# Patient Record
Sex: Male | Born: 1995 | Race: Black or African American | Hispanic: No | Marital: Single | Smoking: Never smoker
Health system: Southern US, Community
[De-identification: ages and names within clinical notes are randomized; demographics above are authoritative.]

---
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
## 2022-02-10 ENCOUNTER — Encounter (HOSPITAL_COMMUNITY): Payer: Self-pay | Admitting: Emergency Medicine

## 2022-02-10 ENCOUNTER — Emergency Department (HOSPITAL_COMMUNITY)
Admission: EM | Admit: 2022-02-10 | Discharge: 2022-02-10 | Disposition: A | Discharge status: Home / Self Care | Payer: BLUE CROSS/BLUE SHIELD | Attending: Emergency Medicine | Admitting: Emergency Medicine

## 2022-02-10 DIAGNOSIS — R55 Syncope and collapse: Secondary | ICD-10-CM | POA: Insufficient documentation

## 2022-02-10 DIAGNOSIS — U071 COVID-19: Secondary | ICD-10-CM

## 2022-02-10 DIAGNOSIS — R509 Fever, unspecified: Secondary | ICD-10-CM | POA: Insufficient documentation

## 2022-02-10 LAB — CBC WITH DIFFERENTIAL/PLATELET
Abs Immature Granulocytes: 0 10*3/uL (ref 0.00–0.07)
Basophils Absolute: 0 10*3/uL (ref 0.0–0.1)
Basophils Relative: 0 %
Eosinophils Absolute: 0 10*3/uL (ref 0.0–0.5)
Eosinophils Relative: 0 %
HCT: 45 % (ref 39.0–52.0)
Hemoglobin: 15.4 g/dL (ref 13.0–17.0)
Immature Granulocytes: 0 %
Lymphocytes Relative: 28 %
Lymphs Abs: 0.9 10*3/uL (ref 0.7–4.0)
MCH: 28.5 pg (ref 26.0–34.0)
MCHC: 34.2 g/dL (ref 30.0–36.0)
MCV: 83.3 fL (ref 80.0–100.0)
Monocytes Absolute: 0.5 10*3/uL (ref 0.1–1.0)
Monocytes Relative: 14 %
Neutro Abs: 1.9 10*3/uL (ref 1.7–7.7)
Neutrophils Relative %: 58 %
Platelets: 202 10*3/uL (ref 150–400)
RBC: 5.4 MIL/uL (ref 4.22–5.81)
RDW: 14 % (ref 11.5–15.5)
WBC: 3.2 10*3/uL — ABNORMAL LOW (ref 4.0–10.5)
nRBC: 0 % (ref 0.0–0.2)

## 2022-02-10 LAB — COMPREHENSIVE METABOLIC PANEL
ALT: 19 U/L (ref 0–44)
AST: 25 U/L (ref 15–41)
Albumin: 3.8 g/dL (ref 3.5–5.0)
Alkaline Phosphatase: 28 U/L — ABNORMAL LOW (ref 38–126)
Anion gap: 8 (ref 5–15)
BUN: 9 mg/dL (ref 6–20)
CO2: 24 mmol/L (ref 22–32)
Calcium: 8.8 mg/dL — ABNORMAL LOW (ref 8.9–10.3)
Chloride: 106 mmol/L (ref 98–111)
Creatinine, Ser: 1.13 mg/dL (ref 0.61–1.24)
GFR, Estimated: >60 mL/min (ref >60)
Glucose, Bld: 110 mg/dL — ABNORMAL HIGH (ref 70–99)
Potassium: 4 mmol/L (ref 3.5–5.1)
Sodium: 138 mmol/L (ref 135–145)
Total Bilirubin: 0.5 mg/dL (ref 0.3–1.2)
Total Protein: 7 g/dL (ref 6.5–8.1)

## 2022-02-10 MED ORDER — ACETAMINOPHEN 325 MG PO TABS
650.0000 mg | ORAL_TABLET | Freq: Once | ORAL | Status: AC
  Administered 2022-02-10: 650 mg via ORAL
  Filled 2022-02-10: qty 2

## 2022-02-10 MED ORDER — ACETAMINOPHEN 500 MG PO TABS
1000.0000 mg | ORAL_TABLET | Freq: Once | ORAL | Status: DC
  Filled 2022-02-10: qty 2

## 2022-02-10 NOTE — ED Provider Notes (Signed)
[facility] EMERGENCY DEPARTMENT Provider Note   CSN: [id number] Arrival date & time: 02/10/22  0440     History  Chief Complaint  Patient presents with   Loss of Consciousness    Eric Nguyen is a 26 y.o. male.  Patient presents via EMS after brief syncopal event. Pt indicates dx with covid two days ago, this AM got up to go to bathroom, felt lightheaded/faint, and w syncope. Denies injury or pain. No preceding chest pain or discomfort. No palpitations. No sob or unusual doe. No headache. States symptoms have been non prod cough, fatigue, body aches, mildly sore throat, fever.  EMS notes CBG 108. Pt indicates feels fine now - no faintness. Denies hx syncope. No hx heart issues or dysrhythmia.   The history is provided by the patient, medical records and the EMS personnel.  Loss of Consciousness Associated symptoms: fever   Associated symptoms: no chest pain, no confusion, no headaches, no palpitations, no shortness of breath, no vomiting and no weakness        Home Medications Prior to Admission medications   Medication Sig Start Date End Date Taking? Authorizing Provider  diphenhydrAMINE (BENADRYL) 25 mg capsule Take 25 mg by mouth every 6 (six) hours as needed for allergies.    [provider]  ibuprofen (ADVIL,MOTRIN) 200 MG tablet Take 200-600 mg by mouth every 6 (six) hours as needed for moderate pain.    [provider]      Allergies    Patient has no known allergies.    Review of Systems   Review of Systems  Constitutional:  Positive for fever.  HENT:  Positive for congestion and sore throat. Negative for trouble swallowing.   Eyes:  Negative for redness and visual disturbance.  Respiratory:  Positive for cough. Negative for shortness of breath.   Cardiovascular:  Positive for syncope. Negative for chest pain, palpitations and leg swelling.  Gastrointestinal:  Negative for abdominal pain, blood in stool, diarrhea and vomiting.   Genitourinary:  Negative for dysuria.  Musculoskeletal:  Negative for back pain, neck pain and neck stiffness.  Skin:  Negative for rash.  Neurological:  Negative for weakness, numbness and headaches.  Hematological:  Does not bruise/bleed easily.  Psychiatric/Behavioral:  Negative for confusion.     Physical Exam Updated Vital Signs BP 122/77   Pulse 80   Temp 99.8 F (37.7 C) (Oral)   Resp 18   Ht 1.854 m (6\' 1" )   Wt 99.8 kg   SpO2 97%   BMI 29.03 kg/m  Physical Exam Vitals and nursing note reviewed.  Constitutional:      Appearance: Normal appearance. He is well-developed.  HENT:     Head: Atraumatic.     Comments: No face or scalp contusion or sts.     Nose: Nose normal.     Mouth/Throat:     Mouth: Mucous membranes are moist.     Pharynx: Oropharynx is clear.  Eyes:     General: No scleral icterus.    Conjunctiva/sclera: Conjunctivae normal.     Pupils: Pupils are equal, round, and reactive to light.  Neck:     Vascular: No carotid bruit.     Trachea: No tracheal deviation.  Cardiovascular:     Rate and Rhythm: Normal rate and regular rhythm.     Pulses: Normal pulses.     Heart sounds: Normal heart sounds. No murmur heard.    No friction rub. No gallop.  Pulmonary:  Effort: Pulmonary effort is normal. No accessory muscle usage or respiratory distress.     Breath sounds: Normal breath sounds.  Chest:     Chest wall: No tenderness.  Abdominal:     General: There is no distension.     Palpations: Abdomen is soft.     Tenderness: There is no abdominal tenderness. There is no guarding.  Genitourinary:    Comments: No cva tenderness. Musculoskeletal:        General: No swelling.     Cervical back: Normal range of motion and neck supple. No rigidity or tenderness.     Right lower leg: No edema.     Left lower leg: No edema.     Comments: CTLS spine, non tender, aligned, no step off. No focal extremity pain or tenderness. No swelling.   Skin:     General: Skin is warm and dry.     Findings: No rash.  Neurological:     Mental Status: He is alert.     Comments: Alert, speech clear. GCS 15. Motor/sens grossly intact bil. Steady gait.   Psychiatric:        Mood and Affect: Mood normal.     ED Results / Procedures / Treatments   Labs (all labs ordered are listed, but only abnormal results are displayed) Results for orders placed or performed during the hospital encounter of 02/10/22  Comprehensive metabolic panel  Result Value Ref Range   Sodium 138 135 - 145 mmol/L   Potassium 4.0 3.5 - 5.1 mmol/L   Chloride 106 98 - 111 mmol/L   CO2 24 22 - 32 mmol/L   Glucose, Bld 110 (H) 70 - 99 mg/dL   BUN 9 6 - 20 mg/dL   Creatinine, Ser 4.09 0.61 - 1.24 mg/dL   Calcium 8.8 (L) 8.9 - 10.3 mg/dL   Total Protein 7.0 6.5 - 8.1 g/dL   Albumin 3.8 3.5 - 5.0 g/dL   AST 25 15 - 41 U/L   ALT 19 0 - 44 U/L   Alkaline Phosphatase 28 (L) 38 - 126 U/L   Total Bilirubin 0.5 0.3 - 1.2 mg/dL   GFR, Estimated >81 >19 mL/min   Anion gap 8 5 - 15  CBC with Differential  Result Value Ref Range   WBC 3.2 (L) 4.0 - 10.5 K/uL   RBC 5.40 4.22 - 5.81 MIL/uL   Hemoglobin 15.4 13.0 - 17.0 g/dL   HCT 14.7 82.9 - 56.2 %   MCV 83.3 80.0 - 100.0 fL   MCH 28.5 26.0 - 34.0 pg   MCHC 34.2 30.0 - 36.0 g/dL   RDW 13.0 86.5 - 78.4 %   Platelets 202 150 - 400 K/uL   nRBC 0.0 0.0 - 0.2 %   Neutrophils Relative % 58 %   Neutro Abs 1.9 1.7 - 7.7 K/uL   Lymphocytes Relative 28 %   Lymphs Abs 0.9 0.7 - 4.0 K/uL   Monocytes Relative 14 %   Monocytes Absolute 0.5 0.1 - 1.0 K/uL   Eosinophils Relative 0 %   Eosinophils Absolute 0.0 0.0 - 0.5 K/uL   Basophils Relative 0 %   Basophils Absolute 0.0 0.0 - 0.1 K/uL   Immature Granulocytes 0 %   Abs Immature Granulocytes 0.00 0.00 - 0.07 K/uL    EKG EKG Interpretation  Date/Time:  Wednesday February 10 2022 04:51:20 EDT Ventricular Rate:  96 PR Interval:  144 QRS Duration: 82 QT Interval:  314 QTC  Calculation: 396 R Axis:  92 Text Interpretation: Normal sinus rhythm No previous ECGs available Confirmed by Lajean Saver [phone]) on 02/10/2022 8:45:45 AM  Radiology No results found.  Procedures Procedures    Medications Ordered in ED Medications  acetaminophen (TYLENOL) tablet 1,000 mg (has no administration in time range)  acetaminophen (TYLENOL) tablet 650 mg (650 mg Oral Given 02/10/22 0511)    ED Course/ Medical Decision Making/ A&P                           Medical Decision Making Problems Addressed: COVID-19 virus infection: acute illness or injury with systemic symptoms that poses a threat to life or bodily functions Syncope, unspecified syncope type: acute illness or injury with systemic symptoms that poses a threat to life or bodily functions  Amount and/or Complexity of Data Reviewed Independent Historian: EMS    Details: hx External Data Reviewed: notes. Labs: ordered. Decision-making details documented in ED Course. ECG/medicine tests: ordered and independent interpretation performed. Decision-making details documented in ED Course.  Risk OTC drugs. Decision regarding hospitalization.   Iv ns. Continuous pulse ox and cardiac monitoring. Labs ordered/sent.   Diff dx includes dehydration, aki, symp anemia, vasovagal, orthostasis - dispo decision including potential need for admission if aki/significant dehydration, anemia, considered - will get labs and reassess.   Reviewed nursing notes and prior charts for additional history. External reports reviewed. Additional history from: EMS.   Cardiac monitor: sinus rhythm, rate 80.  Labs reviewed/interpreted by me - hgb normal. Chem normal.   Po fluids/food.   Pt ambulatory about room, no faintness/dizziness.  Pt currently appears stable for d/c.  Return precautions provided.            Final Clinical Impression(s) / ED Diagnoses Final diagnoses:  None    Rx / DC Orders ED Discharge Orders      None         Lajean Saver, MD 02/10/22 [phone]

## 2022-02-10 NOTE — ED Provider Triage Note (Signed)
Emergency Medicine Provider Triage Evaluation Note  Eric Nguyen , a 26 y.o. male  was evaluated in triage.  Pt complains of syncope tonight. Not feeling well with covid x 2 days- fever, chills, cough, taking mucinex, tylenol & delsym for sxs. Not sleeping well. Tonight woke up and went to the bathroom, felt a bit dizzy when urinating, worse as he returned to bed then passed out. Not sure if he hit his head, LOC was brief, family arrived shortly after.  Review of Systems  Per above.   Physical Exam  BP 125/78 (BP Location: Right Arm)   Pulse (!) 106   Temp 99.8 F (37.7 C) (Oral)   Resp 16   SpO2 97%  Gen:   Awake, no distress   Resp:  Normal effort  MSK:   Moves extremities without difficulty  Other:  No racoon eyes/battle sign. No c spine TTP. PERRL. EOMI. Sensation & strength grossly intact x 4.   Medical Decision Making  Medically screening exam initiated at 4:47 AM.  Appropriate orders placed.  Eric Nguyen was informed that the remainder of the evaluation will be completed by another provider, this initial triage assessment does not replace that evaluation, and the importance of remaining in the ED until their evaluation is complete.  syncope   Cherly Anderson, PA-C 02/10/22 0448

## 2022-02-10 NOTE — Discharge Instructions (Addendum)
It was our pleasure to provide your ER care today - we hope that you feel better.  Drink plenty of fluids/stay well hydrated. Get adequate nutrition.   Stay active, breathe fully and deeply.   Take acetaminophen or ibuprofen as need.   Follow up with primary care doctor in two weeks if symptoms fail to improve/resolve.  Return to ER if worse, new symptoms, chest pain, trouble breathing, recurrent weakness/fainting, or other concern.

## 2022-02-10 NOTE — ED Triage Notes (Signed)
Per EMS, Pt is COVID positive, tested on Monday.  Symptoms include sore throat, cough, fatigue, trouble sleeping.  Pt woke up to use the bathroom, on his way back to to bed, he had a syncope episode, no injury from fall but remembers situation.  Pt is alert and oriented.  132/82 99P 97% RA CBG 108

## 2023-11-16 ENCOUNTER — Encounter (HOSPITAL_BASED_OUTPATIENT_CLINIC_OR_DEPARTMENT_OTHER): Payer: Self-pay | Admitting: *Deleted

## 2023-11-16 ENCOUNTER — Other Ambulatory Visit: Payer: Self-pay

## 2023-11-16 ENCOUNTER — Emergency Department (HOSPITAL_BASED_OUTPATIENT_CLINIC_OR_DEPARTMENT_OTHER)
Admission: EM | Admit: 2023-11-16 | Discharge: 2023-11-16 | Disposition: A | Discharge status: Home / Self Care | Payer: Self-pay | Attending: Emergency Medicine | Admitting: Emergency Medicine

## 2023-11-16 DIAGNOSIS — R590 Localized enlarged lymph nodes: Secondary | ICD-10-CM | POA: Insufficient documentation

## 2023-11-16 DIAGNOSIS — J029 Acute pharyngitis, unspecified: Secondary | ICD-10-CM | POA: Insufficient documentation

## 2023-11-16 LAB — RESP PANEL BY RT-PCR (RSV, FLU A&B, COVID)  RVPGX2
Influenza A by PCR: NEGATIVE
Influenza B by PCR: NEGATIVE
Resp Syncytial Virus by PCR: NEGATIVE
SARS Coronavirus 2 by RT PCR: NEGATIVE

## 2023-11-16 LAB — GROUP A STREP BY PCR: Group A Strep by PCR: NOT DETECTED

## 2023-11-16 NOTE — ED Provider Notes (Signed)
 [place] EMERGENCY DEPARTMENT AT [facility] Provider Note   CSN: [id number] Arrival date & time: 11/16/23  2027     History  Chief Complaint  Patient presents with   Sore Throat    Eric Nguyen is a 28 y.o. male otherwise healthy presents with complaints of 1 week of worsening sore throat.  Describes pain with swallowing.  No significant difficulty swallowing or difficulty breathing.  Denies any fevers, chills, cough, congestion.  Does have a history of seasonal allergies and feels like he has been sneezing more.   Sore Throat   History reviewed. No pertinent past medical history. History reviewed. No pertinent surgical history.      Home Medications Prior to Admission medications   Medication Sig Start Date End Date Taking? Authorizing Provider  diphenhydrAMINE (BENADRYL) 25 mg capsule Take 25 mg by mouth every 6 (six) hours as needed for allergies.    [provider]  ibuprofen (ADVIL,MOTRIN) 200 MG tablet Take 200-600 mg by mouth every 6 (six) hours as needed for moderate pain.    [provider]      Allergies    Patient has no known allergies.    Review of Systems   Review of Systems  HENT:  Positive for sore throat.     Physical Exam Updated Vital Signs BP 116/76   Pulse 68   Temp 98.8 F (37.1 C) (Oral)   Resp 18   Ht 6\' 1"  (1.854 m)   Wt 90.7 kg   SpO2 99%   BMI 26.39 kg/m  Physical Exam Vitals and nursing note reviewed.  Constitutional:      General: He is not in acute distress.    Appearance: He is well-developed.  HENT:     Head: Normocephalic and atraumatic.     Mouth/Throat:     Pharynx: No oropharyngeal exudate or posterior oropharyngeal erythema.  Eyes:     Conjunctiva/sclera: Conjunctivae normal.  Cardiovascular:     Rate and Rhythm: Normal rate and regular rhythm.     Heart sounds: No murmur heard. Pulmonary:     Effort: Pulmonary effort is normal. No respiratory distress.     Breath sounds: Normal  breath sounds.  Musculoskeletal:        General: No swelling.     Cervical back: Neck supple.  Lymphadenopathy:     Cervical: Cervical adenopathy present.  Skin:    General: Skin is warm and dry.     Capillary Refill: Capillary refill takes less than 2 seconds.  Neurological:     Mental Status: He is alert.  Psychiatric:        Mood and Affect: Mood normal.     ED Results / Procedures / Treatments   Labs (all labs ordered are listed, but only abnormal results are displayed) Labs Reviewed  GROUP A STREP BY PCR  RESP PANEL BY RT-PCR (RSV, FLU A&B, COVID)  [id number]    EKG None  Radiology No results found.  Procedures Procedures    Medications Ordered in ED Medications - No data to display  ED Course/ Medical Decision Making/ A&P                                 Medical Decision Making  This patient presents to the ED with chief complaint(s) of sore throat.  The complaint involves an extensive differential diagnosis and also carries with it a high risk of complications and morbidity.  Pertinent past medical history as listed in HPI  The differential diagnosis includes  Strep, URI, Ludwig's angina, retropharyngeal abscess Additional history obtained: Additional history obtained from family Records reviewed Care Everywhere/External Records  Assessment and management:   Hemodynamically stable, afebrile, nontoxic-appearing patient presenting with complaints of sore throat x 1 week.  Describes pain with swallowing.  No difficulty swallowing or breathing.  Denies any cough, congestion, fevers or chills.  Does report his seasonal allergies have been flaring up recently.  On exam he has no exudate, he does have left-sided cervical lymphadenopathy.  He has no facial or submandibular swelling, tolerates full cervical range of motion without discomfort, no obvious peritonsillar abscess.  Do not suspect Ludwig's angina or retropharyngeal abscess.  Respiratory panel and strep tests  are negative.  Overall suspect pharyngitis.  Dissipate his symptoms will resolve within the next week.  However will provide ENT follow-up should symptoms persist.  Independent ECG interpretation:  none  Independent labs interpretation:  The following labs were independently interpreted:  Strep test and resp panel negative  Independent visualization and interpretation of imaging: I independently visualized the following imaging with scope of interpretation limited to determining acute life threatening conditions related to emergency care: none    Consultations obtained:   none  Disposition:   Patient will be discharged home. The patient has been appropriately medically screened and/or stabilized in the ED. I have low suspicion for any other emergent medical condition which would require further screening, evaluation or treatment in the ED or require inpatient management. At time of discharge the patient is hemodynamically stable and in no acute distress. I have discussed work-up results and diagnosis with patient and answered all questions. Patient is agreeable with discharge plan. We discussed strict return precautions for returning to the emergency department and they verbalized understanding.     Social Determinants of Health:   none  This note was dictated with voice recognition software.  Despite best efforts at proofreading, errors may have occurred which can change the documentation meaning.          Final Clinical Impression(s) / ED Diagnoses Final diagnoses:  Sore throat  Cervical lymphadenopathy    Rx / DC Orders ED Discharge Orders     None         Felicie Horning, PA-C 11/16/23 2205    Lowery Rue, DO 11/16/23 2241

## 2023-11-16 NOTE — ED Notes (Signed)
 Reviewed AVS/discharge instruction with patient. Time allotted for and all questions answered. Patient is agreeable for d/c and escorted to ed exit by staff.

## 2023-11-16 NOTE — ED Triage Notes (Signed)
 Patient reporting approximately 1 week of worsening sore throat. No obvious swelling. No fevers, cough or congestion.

## 2023-11-16 NOTE — Discharge Instructions (Addendum)
 You were evaluated in the emergency room for sore throat.  On exam you have inflamed cervical lymph node.  This can occur during a virus or allergies.  Your respiratory panel and strep test were negative.  Please call the number on the sheet to schedule with a ears nose and throat doctor.
